# Patient Record
Sex: Female | Born: 1960 | Race: Black or African American | Hispanic: No | Marital: Married | Smoking: Never smoker
Health system: Southern US, Community
[De-identification: ages and names within clinical notes are randomized; demographics above are authoritative.]

## PROBLEM LIST (undated history)

## (undated) DIAGNOSIS — I10 Essential (primary) hypertension: Secondary | ICD-10-CM

## (undated) HISTORY — PX: TUBAL LIGATION: SHX77

---

## 2016-03-10 ENCOUNTER — Ambulatory Visit (HOSPITAL_COMMUNITY)
Admission: EM | Admit: 2016-03-10 | Discharge: 2016-03-10 | Disposition: A | Discharge status: Home / Self Care | Payer: Self-pay | Attending: Emergency Medicine | Admitting: Emergency Medicine

## 2016-03-10 ENCOUNTER — Encounter (HOSPITAL_COMMUNITY): Payer: Self-pay | Admitting: Emergency Medicine

## 2016-03-10 DIAGNOSIS — Z9889 Other specified postprocedural states: Secondary | ICD-10-CM | POA: Insufficient documentation

## 2016-03-10 DIAGNOSIS — R300 Dysuria: Secondary | ICD-10-CM | POA: Insufficient documentation

## 2016-03-10 DIAGNOSIS — R102 Pelvic and perineal pain: Secondary | ICD-10-CM | POA: Insufficient documentation

## 2016-03-10 DIAGNOSIS — N898 Other specified noninflammatory disorders of vagina: Secondary | ICD-10-CM | POA: Insufficient documentation

## 2016-03-10 DIAGNOSIS — Z114 Encounter for screening for human immunodeficiency virus [HIV]: Secondary | ICD-10-CM | POA: Insufficient documentation

## 2016-03-10 LAB — POCT URINALYSIS DIP (DEVICE)
BILIRUBIN URINE: NEGATIVE
Glucose, UA: NEGATIVE mg/dL
HGB URINE DIPSTICK: NEGATIVE
Ketones, ur: NEGATIVE mg/dL
LEUKOCYTES UA: NEGATIVE
Nitrite: NEGATIVE
Protein, ur: NEGATIVE mg/dL
SPECIFIC GRAVITY, URINE: 1.025 (ref 1.005–1.030)
Urobilinogen, UA: 0.2 mg/dL (ref 0.0–1.0)
pH: 5.5 (ref 5.0–8.0)

## 2016-03-10 MED ORDER — CEFTRIAXONE SODIUM 250 MG IJ SOLR
250.0000 mg | Freq: Once | INTRAMUSCULAR | Status: AC
  Administered 2016-03-10: 250 mg via INTRAMUSCULAR

## 2016-03-10 MED ORDER — LIDOCAINE HCL (PF) 1 % IJ SOLN
INTRAMUSCULAR | Status: AC
  Filled 2016-03-10: qty 2

## 2016-03-10 MED ORDER — AZITHROMYCIN 250 MG PO TABS
1000.0000 mg | ORAL_TABLET | Freq: Once | ORAL | Status: AC
  Administered 2016-03-10: 1000 mg via ORAL

## 2016-03-10 MED ORDER — AZITHROMYCIN 250 MG PO TABS
ORAL_TABLET | ORAL | Status: AC
  Filled 2016-03-10: qty 1

## 2016-03-10 MED ORDER — CEFTRIAXONE SODIUM 250 MG IJ SOLR
INTRAMUSCULAR | Status: AC
  Filled 2016-03-10: qty 250

## 2016-03-10 NOTE — Discharge Instructions (Signed)
You are being treated today for bacterial infection in your vaginal area. You have been given medication here that cover for common infections. Sampled were taken during the exam and will be tested. Should we need to change your antibiotics you will be contacted and a prescription will be written. I recommend you establish with a primary care provider and follow up with him or her for further evaluation and management.

## 2016-03-10 NOTE — ED Provider Notes (Signed)
CSN: [id number]     Arrival date & time 03/10/16  1007 History   First MD Initiated Contact with Patient 03/10/16 1112     Chief Complaint  Patient presents with  . Abdominal Pain   (Consider location/radiation/quality/duration/timing/severity/associated sxs/prior Treatment) 56 year old female presents to clinic with chief complaint of vaginal pain and irritation for many years. Patient is recently immigrated to the [place] from [place], been in country 1 week. States she has not received previous medical care. She states she has vaginal pain and itching, a discharge, and lower abdominal pain along with an odor. She denies fever or flank pain, N/V/D, or other systemic symptoms.   The history is provided by the patient and a relative. The history is limited by a language barrier.  Abdominal Pain  Pain location:  Suprapubic Associated symptoms: dysuria and vaginal discharge   Associated symptoms: no diarrhea, no fatigue, no fever, no nausea and no vomiting     History reviewed. No pertinent past medical history. Past Surgical History:  Procedure Laterality Date  . TUBAL LIGATION     History reviewed. No pertinent family history. Social History  Substance Use Topics  . Smoking status: Never Smoker  . Smokeless tobacco: Never Used  . Alcohol use No   OB History    No data available     Review of Systems  Constitutional: Negative for fatigue and fever.  HENT: Negative.   Respiratory: Negative.   Cardiovascular: Negative.   Gastrointestinal: Positive for abdominal pain. Negative for diarrhea, nausea and vomiting.  Genitourinary: Positive for dyspareunia, dysuria, frequency, vaginal discharge and vaginal pain. Negative for difficulty urinating and flank pain.  Musculoskeletal: Negative.   Neurological: Negative.   Hematological: Negative.     Allergies  Patient has no known allergies.  Home Medications   Prior to Admission medications   Not on File   Meds Ordered and  Administered this Visit   Medications  azithromycin (ZITHROMAX) tablet 1,000 mg (1,000 mg Oral Given 03/10/16 1150)  cefTRIAXone (ROCEPHIN) injection 250 mg (250 mg Intramuscular Given 03/10/16 1151)    BP 168/95 (BP Location: Right Arm) Comment (BP Location): large cuff  Pulse 91   Temp 99.1 F (37.3 C) (Oral)   Resp 20   SpO2 100%  No data found.   Physical Exam  Constitutional: She is oriented to person, place, and time. She appears well-developed and well-nourished. No distress.  HENT:  Head: Normocephalic.  Right Ear: External ear normal.  Left Ear: External ear normal.  Cardiovascular: Normal rate and regular rhythm.   Pulmonary/Chest: Effort normal and breath sounds normal.  Abdominal: Soft. Normal appearance and bowel sounds are normal. There is no hepatosplenomegaly. There is tenderness in the suprapubic area. There is no rigidity, no guarding and no CVA tenderness. Hernia confirmed negative in the right inguinal area and confirmed negative in the left inguinal area.  Genitourinary: Pelvic exam was performed with patient prone. There is no rash, tenderness, lesion or injury on the right labia. There is no rash, tenderness, lesion or injury on the left labia. Cervix exhibits discharge. Cervix exhibits no motion tenderness and no friability. Right adnexum displays no mass, no tenderness and no fullness. Left adnexum displays no mass, no tenderness and no fullness. There is tenderness in the vagina. No erythema or bleeding in the vagina. No foreign body in the vagina. No signs of injury around the vagina. Vaginal discharge found.  Genitourinary Comments: Creamy white discharge present from the cervix, no pain to  the uterus with manual palpation, no tenderness with palpation to the ovarian areas, ovaries not palpable on exam.  Lymphadenopathy:       Right: No inguinal adenopathy present.       Left: No inguinal adenopathy present.  Neurological: She is alert and oriented to person,  place, and time.  Skin: Skin is warm and dry. Capillary refill takes less than 2 seconds. She is not diaphoretic. No erythema. No pallor.  Psychiatric: She has a normal mood and affect.  Nursing note and vitals reviewed.   Urgent Care Course   Clinical Course     Procedures (including critical care time)  Labs Review Labs Reviewed  HIV ANTIBODY (ROUTINE TESTING)  POCT URINALYSIS DIP (DEVICE)  CERVICOVAGINAL ANCILLARY ONLY    Imaging Review No results found.   Visual Acuity Review  Right Eye Distance:   Left Eye Distance:   Bilateral Distance:    Right Eye Near:   Left Eye Near:    Bilateral Near:         MDM   1. Pelvic pain in female    Ua, Cervical cytology, and HIV antibody tests obtained. Patient treated in clinic with Azithromycin and Ceftriaxone. Patient encouraged to establish with a primary care provider for further evaluation and management as well as for a physical exam. Patient to be notified of the results of her tests and of any adjustments needed with her therapy.      Barnet Glasgow, NP 03/10/16 1223

## 2016-03-10 NOTE — ED Notes (Signed)
Verified phone number 

## 2016-03-10 NOTE — ED Triage Notes (Signed)
The patient presented to the [facility] with a complaint of lower abdominal pain x years. The patient has a hx of UTI.

## 2016-03-11 LAB — HIV ANTIBODY (ROUTINE TESTING W REFLEX): HIV Screen 4th Generation wRfx: NONREACTIVE

## 2016-03-12 LAB — CERVICOVAGINAL ANCILLARY ONLY
Chlamydia: NEGATIVE
Neisseria Gonorrhea: NEGATIVE
WET PREP (BD AFFIRM): POSITIVE — AB

## 2016-03-14 ENCOUNTER — Telehealth (HOSPITAL_COMMUNITY): Payer: Self-pay | Admitting: Emergency Medicine

## 2016-03-14 MED ORDER — METRONIDAZOLE 500 MG PO TABS: 500.0000 mg | ORAL_TABLET | Freq: Two times a day (BID) | ORAL | 0 refills | Status: DC

## 2016-03-14 MED ORDER — FLUCONAZOLE 150 MG PO TABS: 150.0000 mg | ORAL_TABLET | Freq: Every day | ORAL | 0 refills | Status: DC

## 2016-03-14 NOTE — Telephone Encounter (Signed)
-----   Message from Barnet Glasgow, NP sent at 03/12/2016  9:59 AM EST ----- Please let the patient know she was positive for Gardnerella and Candida. The treatment she received the day of her visit will not cover her infection. Call into the pharmacy of her choice Flagyl 500 mg BID x7 days, Diflucan 200 mg x2, Take one pill now, then take the second pill in 3 days.

## 2016-03-14 NOTE — Telephone Encounter (Signed)
Called pt and notified of recent lab results Pt ID'd properly... Reports feeling better Adv pt that I will be calling some meds for her Per her request... Sent them to [facility])  Adv pt if sx are not getting better to return or to f/u w/PCP Pt verb understanding.

## 2016-04-09 ENCOUNTER — Ambulatory Visit: Payer: Self-pay | Attending: Internal Medicine | Admitting: Physician Assistant

## 2016-04-09 VITALS — BP 152/108 | HR 92 | Temp 98.5°F | Resp 16 | Wt 197.0 lb

## 2016-04-09 DIAGNOSIS — M79605 Pain in left leg: Secondary | ICD-10-CM | POA: Insufficient documentation

## 2016-04-09 DIAGNOSIS — Z9889 Other specified postprocedural states: Secondary | ICD-10-CM | POA: Insufficient documentation

## 2016-04-09 DIAGNOSIS — M25561 Pain in right knee: Secondary | ICD-10-CM | POA: Insufficient documentation

## 2016-04-09 DIAGNOSIS — R102 Pelvic and perineal pain: Secondary | ICD-10-CM | POA: Insufficient documentation

## 2016-04-09 DIAGNOSIS — M7989 Other specified soft tissue disorders: Secondary | ICD-10-CM | POA: Insufficient documentation

## 2016-04-09 DIAGNOSIS — Z79899 Other long term (current) drug therapy: Secondary | ICD-10-CM | POA: Insufficient documentation

## 2016-04-09 DIAGNOSIS — M2550 Pain in unspecified joint: Secondary | ICD-10-CM

## 2016-04-09 DIAGNOSIS — M25562 Pain in left knee: Secondary | ICD-10-CM | POA: Insufficient documentation

## 2016-04-09 DIAGNOSIS — I10 Essential (primary) hypertension: Secondary | ICD-10-CM | POA: Insufficient documentation

## 2016-04-09 LAB — CBC WITH DIFFERENTIAL/PLATELET
BASOS ABS: 0 {cells}/uL (ref 0–200)
BASOS PCT: 0 %
EOS PCT: 2 %
Eosinophils Absolute: 122 cells/uL (ref 15–500)
HCT: 41.5 % (ref 35.0–45.0)
Hemoglobin: 13.9 g/dL (ref 11.7–15.5)
Lymphocytes Relative: 45 %
Lymphs Abs: 2745 cells/uL (ref 850–3900)
MCH: 28.8 pg (ref 27.0–33.0)
MCHC: 33.5 g/dL (ref 32.0–36.0)
MCV: 85.9 fL (ref 80.0–100.0)
MONOS PCT: 8 %
MPV: 9.4 fL (ref 7.5–12.5)
Monocytes Absolute: 488 cells/uL (ref 200–950)
NEUTROS ABS: 2745 {cells}/uL (ref 1500–7800)
Neutrophils Relative %: 45 %
PLATELETS: 282 10*3/uL (ref 140–400)
RBC: 4.83 MIL/uL (ref 3.80–5.10)
RDW: 13.8 % (ref 11.0–15.0)
WBC: 6.1 10*3/uL (ref 3.8–10.8)

## 2016-04-09 LAB — BASIC METABOLIC PANEL
BUN: 17 mg/dL (ref 7–25)
CALCIUM: 10.1 mg/dL (ref 8.6–10.4)
CO2: 24 mmol/L (ref 20–31)
Chloride: 106 mmol/L (ref 98–110)
Creat: 1.1 mg/dL — ABNORMAL HIGH (ref 0.50–1.05)
Glucose, Bld: 130 mg/dL — ABNORMAL HIGH (ref 65–99)
Potassium: 4.8 mmol/L (ref 3.5–5.3)
Sodium: 140 mmol/L (ref 135–146)

## 2016-04-09 LAB — TSH: TSH: 1.51 m[IU]/L

## 2016-04-09 MED ORDER — LISINOPRIL 10 MG PO TABS: 10.0000 mg | ORAL_TABLET | Freq: Every day | ORAL | 3 refills | Status: DC

## 2016-04-09 NOTE — Progress Notes (Signed)
Does not take medication for BP. She did at one time at home.

## 2016-04-09 NOTE — Progress Notes (Signed)
Carrin Vannostrand  [id number]  [id number]  DOB - 1960/07/24  Chief Complaint  Patient presents with  . Hypertension  . Pelvic Pain  . Joint Pain       Subjective:   Shannon Singleton is a 56 y.o. female here today for establishment of care. She has little PMHx documented. She does state that in [place] she had some high blood pressure and use to take medication. She was in the ED on 03/10/2016 with complaints of vaginal pain, irritation, itching and lower abdominal pain. She also had an odor. No fevers or chills. No nausea, vomiting or diaphoresis. Her blood pressure was elevated at 168/95. A dipstick was negative. HIV was negative. Abdomen cultures revealed Gardnerella and candida and she was treated with Flagyl and Diflucan.  From a discharge standpoint she is doing much better. She still has persistent pelvic pain. Her last menstrual period was in 2003. She has a history of fibroids possibly. She  She also complains of bilateral knee pain. She states that multiple joints ache all the time. She has pain down her left calf with some swelling left side greater than right.   ROS: GEN: denies fever or chills, denies change in weight Skin: denies lesions or rashes HEENT: denies headache, earache, epistaxis, sore throat, or neck pain LUNGS: denies SHOB, dyspnea, PND, orthopnea CV: denies CP or palpitations ABD: denies abd pain, N or V; +pelvic pain EXT: denies muscle spasms +swelling; + pain in lower ext, no weakness NEURO: denies numbness or tingling, denies sz, stroke or TIA  ALLERGIES: No Known Allergies  PAST MEDICAL HISTORY: No past medical history on file.  PAST SURGICAL HISTORY: Past Surgical History:  Procedure Laterality Date  . TUBAL LIGATION      MEDICATIONS AT HOME: Prior to Admission medications   Medication Sig Start Date End Date Taking? Authorizing Provider  fluconazole (DIFLUCAN) 150 MG tablet Take 1 tablet (150 mg total) by mouth daily. Take one  pill now, then take the second pill in 3 days. Patient not taking: Reported on 04/09/2016 03/14/16   Barnet Glasgow, NP  lisinopril (PRINIVIL,ZESTRIL) 10 MG tablet Take 1 tablet (10 mg total) by mouth daily. 04/09/16   Aedon Deason Daneil Dan, PA-C  metroNIDAZOLE (FLAGYL) 500 MG tablet Take 1 tablet (500 mg total) by mouth 2 (two) times daily. Patient not taking: Reported on 04/09/2016 03/14/16   Barnet Glasgow, NP     Objective:   Vitals:   04/09/16 1337  BP: (!) 152/108  Pulse: 92  Resp: 16  Temp: 98.5 F (36.9 C)  TempSrc: Oral  SpO2: 98%  Weight: 197 lb (89.4 kg)    Exam General appearance : Awake, alert, not in any distress. Speech Clear. Not toxic looking HEENT: Atraumatic and Normocephalic, pupils equally reactive to light and accomodation Neck: supple, no JVD. No cervical lymphadenopathy.  Chest:Good air entry bilaterally, no added sounds  CVS: S1 S2 regular, no murmurs.  Abdomen: Bowel sounds present, Non tender and not distended with no gaurding, rigidity or rebound. Extremities: B/L Lower Ext shows sonme edema, L>R both legs are warm to touch, good pulses; full ROM Neurology: Awake alert, and oriented X 3, CN II-XII intact, Non focal Skin:No Rash   Assessment & Plan  1. HTN  -Add ACE  -DASH diet  -check BMP 2. Joint pain  -routine labs +ANA, ESR, RF  -prn pain meds 3. Left leg pain and swelling  -Venous US 4. Pelvic pain  -GYN referral  -cont NSAIDS prn  Return  in about 2 weeks (around 04/23/2016).  The patient was given clear instructions to go to ER or return to medical center if symptoms don't improve, worsen or new problems develop. The patient verbalized understanding. The patient was told to call to get lab results if they haven't heard anything in the next week.   This note has been created with Surveyor, quantity. Any transcriptional errors are unintentional.    Zettie Pho, PA-C [facility] and  [facility] [place], [place]   04/09/2016, 2:07 PM

## 2016-04-10 ENCOUNTER — Ambulatory Visit (HOSPITAL_COMMUNITY)
Admission: RE | Admit: 2016-04-10 | Discharge: 2016-04-10 | Disposition: A | Discharge status: Home / Self Care | Payer: Self-pay | Source: Ambulatory Visit | Attending: Physician Assistant | Admitting: Physician Assistant

## 2016-04-10 ENCOUNTER — Telehealth: Payer: Self-pay | Admitting: Internal Medicine

## 2016-04-10 DIAGNOSIS — M79605 Pain in left leg: Secondary | ICD-10-CM | POA: Insufficient documentation

## 2016-04-10 LAB — RHEUMATOID FACTOR: Rhuematoid fact SerPl-aCnc: <14 IU/mL (ref <14)

## 2016-04-10 LAB — VITAMIN D 25 HYDROXY (VIT D DEFICIENCY, FRACTURES): Vit D, 25-Hydroxy: 25 ng/mL — ABNORMAL LOW (ref 30–100)

## 2016-04-10 LAB — SEDIMENTATION RATE: Sed Rate: 8 mm/hr (ref 0–30)

## 2016-04-10 NOTE — Telephone Encounter (Signed)
[place] from [facility] called with verbal : venous study negative

## 2016-04-10 NOTE — Progress Notes (Signed)
VASCULAR LAB PRELIMINARY  PRELIMINARY  PRELIMINARY  PRELIMINARY  Left lower extremity venous duplex completed.    Preliminary report:  Left:  No evidence of DVT, superficial thrombosis, or Baker's cyst.  Josee Speece, RVS 04/10/2016, 12:02 PM

## 2016-04-11 LAB — ANA,IFA RA DIAG PNL W/RFLX TIT/PATN
ANA: NEGATIVE
Cyclic Citrullin Peptide Ab: <16 Units

## 2016-04-14 ENCOUNTER — Telehealth: Payer: Self-pay | Admitting: *Deleted

## 2016-04-14 NOTE — Telephone Encounter (Signed)
Patient verified DOB Patient's daughter is aware of [place] being negative for clots. Patient is aware of labs being normal except Vitamin D level being low. Patient advised to pick up an OTC supplement and begin taking. Patient will have a recheck at the next FU visit. Patients daughter expressed her understanding and had no further questions at this time.

## 2016-04-14 NOTE — Telephone Encounter (Signed)
-----   Message from Brayton Caves, [place] sent at 04/14/2016  8:19 AM EST ----- Good Morning [place]!  Can you pls let her or her daughter know that the [place] of her leg was negative for clots or any other abnormality. [place], her blood work looked good with the exception of low Vit D. Encourage her to get a supplement OTC. Encourage her to keep her follow up appt. Thanks.   TN ----- Message ----- From: Interface, Rad Results In Sent: 04/10/2016   6:34 PM To: Brayton Caves, PA-C

## 2016-04-28 ENCOUNTER — Other Ambulatory Visit: Payer: Self-pay | Admitting: *Deleted

## 2016-04-28 DIAGNOSIS — R102 Pelvic and perineal pain: Secondary | ICD-10-CM

## 2016-05-12 ENCOUNTER — Ambulatory Visit (HOSPITAL_COMMUNITY)
Admission: RE | Admit: 2016-05-12 | Discharge: 2016-05-12 | Disposition: A | Discharge status: Home / Self Care | Payer: Self-pay | Source: Ambulatory Visit | Attending: Family Medicine | Admitting: Family Medicine

## 2016-05-12 DIAGNOSIS — D252 Subserosal leiomyoma of uterus: Secondary | ICD-10-CM | POA: Insufficient documentation

## 2016-05-12 DIAGNOSIS — R102 Pelvic and perineal pain: Secondary | ICD-10-CM | POA: Insufficient documentation

## 2016-05-14 ENCOUNTER — Encounter: Payer: Self-pay | Admitting: Obstetrics and Gynecology

## 2016-05-15 ENCOUNTER — Encounter: Payer: Self-pay | Admitting: Obstetrics and Gynecology

## 2016-05-15 NOTE — Progress Notes (Signed)
Patient did not keep GYN appointment for 05/14/2016.  Durene Romans MD Attending Center for [employer])

## 2016-05-18 ENCOUNTER — Encounter (HOSPITAL_COMMUNITY): Payer: Self-pay

## 2016-05-18 ENCOUNTER — Emergency Department (HOSPITAL_COMMUNITY)
Admission: EM | Admit: 2016-05-18 | Discharge: 2016-05-18 | Disposition: A | Discharge status: Home / Self Care | Payer: No Typology Code available for payment source | Attending: Emergency Medicine | Admitting: Emergency Medicine

## 2016-05-18 ENCOUNTER — Emergency Department (HOSPITAL_COMMUNITY): Payer: No Typology Code available for payment source

## 2016-05-18 DIAGNOSIS — S40011A Contusion of right shoulder, initial encounter: Secondary | ICD-10-CM | POA: Insufficient documentation

## 2016-05-18 DIAGNOSIS — Y999 Unspecified external cause status: Secondary | ICD-10-CM | POA: Diagnosis not present

## 2016-05-18 DIAGNOSIS — I10 Essential (primary) hypertension: Secondary | ICD-10-CM | POA: Diagnosis not present

## 2016-05-18 DIAGNOSIS — Z79899 Other long term (current) drug therapy: Secondary | ICD-10-CM | POA: Diagnosis not present

## 2016-05-18 DIAGNOSIS — S4991XA Unspecified injury of right shoulder and upper arm, initial encounter: Secondary | ICD-10-CM | POA: Diagnosis present

## 2016-05-18 DIAGNOSIS — Y92410 Unspecified street and highway as the place of occurrence of the external cause: Secondary | ICD-10-CM | POA: Insufficient documentation

## 2016-05-18 DIAGNOSIS — Y939 Activity, unspecified: Secondary | ICD-10-CM | POA: Diagnosis not present

## 2016-05-18 HISTORY — DX: Essential (primary) hypertension: I10

## 2016-05-18 MED ORDER — IBUPROFEN 800 MG PO TABS
800.0000 mg | ORAL_TABLET | Freq: Once | ORAL | Status: AC
  Administered 2016-05-18: 800 mg via ORAL
  Filled 2016-05-18: qty 1
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
## 2016-05-18 NOTE — ED Notes (Signed)
Patient transported to X-ray 

## 2016-05-18 NOTE — Discharge Instructions (Signed)
Take acetaminophen, ibuprofen or naproxen as needed for pain.

## 2016-05-23 ENCOUNTER — Ambulatory Visit (INDEPENDENT_AMBULATORY_CARE_PROVIDER_SITE_OTHER): Payer: Self-pay | Admitting: Obstetrics & Gynecology

## 2016-05-23 ENCOUNTER — Encounter: Payer: Self-pay | Admitting: Obstetrics & Gynecology

## 2016-05-23 VITALS — BP 139/92 | HR 87 | Wt 208.8 lb

## 2016-05-23 DIAGNOSIS — R102 Pelvic and perineal pain: Secondary | ICD-10-CM

## 2016-05-23 DIAGNOSIS — Z1151 Encounter for screening for human papillomavirus (HPV): Secondary | ICD-10-CM

## 2016-05-23 DIAGNOSIS — Z124 Encounter for screening for malignant neoplasm of cervix: Secondary | ICD-10-CM

## 2016-05-23 DIAGNOSIS — Z01419 Encounter for gynecological examination (general) (routine) without abnormal findings: Secondary | ICD-10-CM

## 2016-05-23 MED ORDER — DICLOFENAC SODIUM 75 MG PO TBEC: 75.0000 mg | DELAYED_RELEASE_TABLET | Freq: Two times a day (BID) | ORAL | 2 refills | Status: AC

## 2016-05-23 MED ORDER — LISINOPRIL 10 MG PO TABS: 10.0000 mg | ORAL_TABLET | Freq: Every day | ORAL | 3 refills | Status: AC

## 2016-05-23 NOTE — Patient Instructions (Signed)
Pelvic Pain, Female Pelvic pain is pain in your lower abdomen, below your belly button and between your hips. The pain may start suddenly (acute), keep coming back (recurring), or last a long time (chronic). Pelvic pain that lasts longer than six months is considered chronic. Pelvic pain may affect your:  Reproductive organs.  Urinary system.  Digestive tract.  Musculoskeletal system. There are many potential causes of pelvic pain. Sometimes, the pain can be a result of digestive or urinary conditions, strained muscles or ligaments, or even reproductive conditions. Sometimes the cause of pelvic pain is not known. Follow these instructions at home:  Take over-the-counter and prescription medicines only as told by your health care provider.  Rest as told by your health care provider.  Do not have sex it if hurts.  Keep a journal of your pelvic pain. Write down:  When the pain started.  Where the pain is located.  What seems to make the pain better or worse, such as food or your menstrual cycle.  Any symptoms you have along with the pain.  Keep all follow-up visits as told by your health care provider. This is important. Contact a health care provider if:  Medicine does not help your pain.  Your pain comes back.  You have new symptoms.  You have abnormal vaginal discharge or bleeding, including bleeding after menopause.  You have a fever or chills.  You are constipated.  You have blood in your urine or stool.  You have foul-smelling urine.  You feel weak or lightheaded. Get help right away if:  You have sudden severe pain.  Your pain gets steadily worse.  You have severe pain along with fever, nausea, vomiting, or excessive sweating.  You lose consciousness. This information is not intended to replace advice given to you by your health care provider. Make sure you discuss any questions you have with your health care provider. Document Released: 01/15/2004  Document Revised: 03/14/2015 Document Reviewed: 12/08/2014 Elsevier Interactive Patient Education  2017 [place]. Hypertension Hypertension, commonly called high blood pressure, is when the force of blood pumping through the arteries is too strong. The arteries are the blood vessels that carry blood from the heart throughout the body. Hypertension forces the heart to work harder to pump blood and may cause arteries to become narrow or stiff. Having untreated or uncontrolled hypertension can cause heart attacks, strokes, kidney disease, and other problems. A blood pressure reading consists of a higher number over a lower number. Ideally, your blood pressure should be below 120/80. The first ("top") number is called the systolic pressure. It is a measure of the pressure in your arteries as your heart beats. The second ("bottom") number is called the diastolic pressure. It is a measure of the pressure in your arteries as the heart relaxes. What are the causes? The cause of this condition is not known. What increases the risk? Some risk factors for high blood pressure are under your control. Others are not. Factors you can change   Smoking.  Having type 2 diabetes mellitus, high cholesterol, or both.  Not getting enough exercise or physical activity.  Being overweight.  Having too much fat, sugar, calories, or salt (sodium) in your diet.  Drinking too much alcohol. Factors that are difficult or impossible to change   Having chronic kidney disease.  Having a family history of high blood pressure.  Age. Risk increases with age.  Race. You may be at higher risk if you are African-American.  Gender.  Men are at higher risk than women before age 16. After age 76, women are at higher risk than men.  Having obstructive sleep apnea.  Stress. What are the signs or symptoms? Extremely high blood pressure (hypertensive crisis) may cause:  Headache.  Anxiety.  Shortness of  breath.  Nosebleed.  Nausea and vomiting.  Severe chest pain.  Jerky movements you cannot control (seizures). How is this diagnosed? This condition is diagnosed by measuring your blood pressure while you are seated, with your arm resting on a surface. The cuff of the blood pressure monitor will be placed directly against the skin of your upper arm at the level of your heart. It should be measured at least twice using the same arm. Certain conditions can cause a difference in blood pressure between your right and left arms. Certain factors can cause blood pressure readings to be lower or higher than normal (elevated) for a short period of time:  When your blood pressure is higher when you are in a health care provider's office than when you are at home, this is called white coat hypertension. Most people with this condition do not need medicines.  When your blood pressure is higher at home than when you are in a health care provider's office, this is called masked hypertension. Most people with this condition may need medicines to control blood pressure. If you have a high blood pressure reading during one visit or you have normal blood pressure with other risk factors:  You may be asked to return on a different day to have your blood pressure checked again.  You may be asked to monitor your blood pressure at home for 1 week or longer. If you are diagnosed with hypertension, you may have other blood or imaging tests to help your health care provider understand your overall risk for other conditions. How is this treated? This condition is treated by making healthy lifestyle changes, such as eating healthy foods, exercising more, and reducing your alcohol intake. Your health care provider may prescribe medicine if lifestyle changes are not enough to get your blood pressure under control, and if:  Your systolic blood pressure is above 130.  Your diastolic blood pressure is above 80. Your  personal target blood pressure may vary depending on your medical conditions, your age, and other factors. Follow these instructions at home: Eating and drinking   Eat a diet that is high in fiber and potassium, and low in sodium, added sugar, and fat. An example eating plan is called the DASH (Dietary Approaches to Stop Hypertension) diet. To eat this way:  Eat plenty of fresh fruits and vegetables. Try to fill half of your plate at each meal with fruits and vegetables.  Eat whole grains, such as whole wheat pasta, brown rice, or whole grain bread. Fill about one quarter of your plate with whole grains.  Eat or drink low-fat dairy products, such as skim milk or low-fat yogurt.  Avoid fatty cuts of meat, processed or cured meats, and poultry with skin. Fill about one quarter of your plate with lean proteins, such as fish, chicken without skin, beans, eggs, and tofu.  Avoid premade and processed foods. These tend to be higher in sodium, added sugar, and fat.  Reduce your daily sodium intake. Most people with hypertension should eat less than 1,500 mg of sodium a day.  Limit alcohol intake to no more than 1 drink a day for nonpregnant women and 2 drinks a day for men. One  drink equals 12 oz of beer, 5 oz of wine, or 1 oz of hard liquor. Lifestyle   Work with your health care provider to maintain a healthy body weight or to lose weight. Ask what an ideal weight is for you.  Get at least 30 minutes of exercise that causes your heart to beat faster (aerobic exercise) most days of the week. Activities may include walking, swimming, or biking.  Include exercise to strengthen your muscles (resistance exercise), such as pilates or lifting weights, as part of your weekly exercise routine. Try to do these types of exercises for 30 minutes at least 3 days a week.  Do not use any products that contain nicotine or tobacco, such as cigarettes and e-cigarettes. If you need help quitting, ask your health  care provider.  Monitor your blood pressure at home as told by your health care provider.  Keep all follow-up visits as told by your health care provider. This is important. Medicines   Take over-the-counter and prescription medicines only as told by your health care provider. Follow directions carefully. Blood pressure medicines must be taken as prescribed.  Do not skip doses of blood pressure medicine. Doing this puts you at risk for problems and can make the medicine less effective.  Ask your health care provider about side effects or reactions to medicines that you should watch for. Contact a health care provider if:  You think you are having a reaction to a medicine you are taking.  You have headaches that keep coming back (recurring).  You feel dizzy.  You have swelling in your ankles.  You have trouble with your vision. Get help right away if:  You develop a severe headache or confusion.  You have unusual weakness or numbness.  You feel faint.  You have severe pain in your chest or abdomen.  You vomit repeatedly.  You have trouble breathing. Summary  Hypertension is when the force of blood pumping through your arteries is too strong. If this condition is not controlled, it may put you at risk for serious complications.  Your personal target blood pressure may vary depending on your medical conditions, your age, and other factors. For most people, a normal blood pressure is less than 120/80.  Hypertension is treated with lifestyle changes, medicines, or a combination of both. Lifestyle changes include weight loss, eating a healthy, low-sodium diet, exercising more, and limiting alcohol. This information is not intended to replace advice given to you by your health care provider. Make sure you discuss any questions you have with your health care provider. Document Released: 02/17/2005 Document Revised: 01/16/2016 Document Reviewed: 01/16/2016 Elsevier Interactive  Patient Education  2017 [employer].

## 2016-05-23 NOTE — Progress Notes (Signed)
Patient never received medication
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
## 2016-05-26 LAB — CYTOLOGY - PAP
DIAGNOSIS: NEGATIVE
HPV (WINDOPATH): NOT DETECTED

## 2016-07-21 ENCOUNTER — Ambulatory Visit (HOSPITAL_COMMUNITY)
Admission: EM | Admit: 2016-07-21 | Discharge: 2016-07-21 | Disposition: A | Discharge status: Home / Self Care | Payer: Self-pay | Attending: Family Medicine | Admitting: Family Medicine

## 2016-07-21 ENCOUNTER — Encounter (HOSPITAL_COMMUNITY): Payer: Self-pay | Admitting: Emergency Medicine

## 2016-07-21 DIAGNOSIS — M25552 Pain in left hip: Secondary | ICD-10-CM

## 2016-07-21 DIAGNOSIS — R109 Unspecified abdominal pain: Secondary | ICD-10-CM

## 2016-07-21 DIAGNOSIS — S39012A Strain of muscle, fascia and tendon of lower back, initial encounter: Secondary | ICD-10-CM

## 2016-07-21 LAB — POCT URINALYSIS DIP (DEVICE)
Bilirubin Urine: NEGATIVE
Glucose, UA: NEGATIVE mg/dL
Hgb urine dipstick: NEGATIVE
KETONES UR: NEGATIVE mg/dL
Leukocytes, UA: NEGATIVE
Nitrite: NEGATIVE
PH: 6 (ref 5.0–8.0)
PROTEIN: NEGATIVE mg/dL
Specific Gravity, Urine: 1.025 (ref 1.005–1.030)
Urobilinogen, UA: 0.2 mg/dL (ref 0.0–1.0)

## 2016-07-21 MED ORDER — NAPROXEN 500 MG PO TABS: 500.0000 mg | ORAL_TABLET | Freq: Two times a day (BID) | ORAL | 0 refills | Status: DC

## 2016-07-21 MED ORDER — CYCLOBENZAPRINE HCL 10 MG PO TABS: ORAL_TABLET | ORAL | 0 refills | Status: DC

## 2016-07-21 NOTE — ED Provider Notes (Signed)
CSN: [id number]     Arrival date & time 07/21/16  1731 History   None    Chief Complaint  Patient presents with  . Abdominal Pain   (Consider location/radiation/quality/duration/timing/severity/associated sxs/prior Treatment) Patient c/o left hip and back pain.  She c/o left wrist splint.   The history is provided by the patient.  Abdominal Pain  Pain location:  Generalized Pain quality: aching   Pain radiates to:  Does not radiate Pain severity:  No pain Onset quality:  Sudden Duration:  1 week Timing:  Constant Progression:  Worsening   Past Medical History:  Diagnosis Date  . Hypertension    Past Surgical History:  Procedure Laterality Date  . TUBAL LIGATION     No family history on file. Social History  Substance Use Topics  . Smoking status: Never Smoker  . Smokeless tobacco: Never Used  . Alcohol use No   OB History    No data available     Review of Systems  Constitutional: Negative.   HENT: Negative.   Eyes: Negative.   Respiratory: Negative.   Cardiovascular: Negative.   Gastrointestinal: Positive for abdominal pain.  Endocrine: Negative.   Genitourinary: Negative.   Musculoskeletal: Positive for arthralgias.  Allergic/Immunologic: Negative.   Neurological: Negative.   Hematological: Negative.   Psychiatric/Behavioral: Negative.  Negative for agitation.    Allergies  Patient has no known allergies.  Home Medications   Prior to Admission medications   Medication Sig Start Date End Date Taking? Authorizing Provider  cyclobenzaprine (FLEXERIL) 10 MG tablet One po tid prn pain 07/21/16   Lysbeth Penner, FNP  diclofenac (VOLTAREN) 75 MG EC tablet Take 1 tablet (75 mg total) by mouth 2 (two) times daily with a meal. 05/23/16   Lavonia Drafts, MD  lisinopril (PRINIVIL,ZESTRIL) 10 MG tablet Take 1 tablet (10 mg total) by mouth daily. Patient taking differently: Take 10 mg by mouth daily. Patient "finished " blood pressure medicine -did  not get more. 05/23/16   Lavonia Drafts, MD  naproxen (NAPROSYN) 500 MG tablet Take 1 tablet (500 mg total) by mouth 2 (two) times daily with a meal. 07/21/16   Bethanie Bloxom, Orson Ape, FNP   Meds Ordered and Administered this Visit  Medications - No data to display  BP (!) 171/101 (BP Location: Right Arm) Comment: rn notified/  patient not taking blood pressure medicine-klh  Pulse 79   Temp 99.2 F (37.3 C) (Oral)   Resp 14   SpO2 99%  No data found.   Physical Exam  Constitutional: She appears well-developed and well-nourished.  HENT:  Head: Normocephalic and atraumatic.  Eyes: Conjunctivae and EOM are normal. Pupils are equal, round, and reactive to light.  Neck: Normal range of motion. Neck supple.  Cardiovascular: Normal rate, regular rhythm and normal heart sounds.   Pulmonary/Chest: Effort normal and breath sounds normal.  Abdominal: Soft. Bowel sounds are normal.  Musculoskeletal: Normal range of motion.  Nursing note and vitals reviewed.   Urgent Care Course     Procedures (including critical care time)  Labs Review Labs Reviewed  POCT URINALYSIS DIP (DEVICE)    Imaging Review No results found.   Visual Acuity Review  Right Eye Distance:   Left Eye Distance:   Bilateral Distance:    Right Eye Near:   Left Eye Near:    Bilateral Near:         MDM   1. Strain of lumbar region, initial encounter   2. Left hip pain  Naprosyn 500mg  one po bid x 10 days Flexeril 10mg  one po tid prn pain #30     Lysbeth Penner, FNP 07/21/16 1842

## 2016-07-21 NOTE — ED Triage Notes (Signed)
Pain in lower back and left leg as well as lower left abdomen.  Onset one week ago.  Left hand numbness, intermittently.

## 2016-10-25 ENCOUNTER — Ambulatory Visit (HOSPITAL_COMMUNITY)
Admission: EM | Admit: 2016-10-25 | Discharge: 2016-10-25 | Disposition: A | Discharge status: Home / Self Care | Payer: Self-pay | Attending: Internal Medicine | Admitting: Internal Medicine

## 2016-10-25 ENCOUNTER — Encounter (HOSPITAL_COMMUNITY): Payer: Self-pay | Admitting: Family Medicine

## 2016-10-25 DIAGNOSIS — R350 Frequency of micturition: Secondary | ICD-10-CM | POA: Insufficient documentation

## 2016-10-25 DIAGNOSIS — I10 Essential (primary) hypertension: Secondary | ICD-10-CM | POA: Insufficient documentation

## 2016-10-25 DIAGNOSIS — M7662 Achilles tendinitis, left leg: Secondary | ICD-10-CM | POA: Insufficient documentation

## 2016-10-25 DIAGNOSIS — R300 Dysuria: Secondary | ICD-10-CM | POA: Insufficient documentation

## 2016-10-25 LAB — POCT URINALYSIS DIP (DEVICE)
Bilirubin Urine: NEGATIVE
GLUCOSE, UA: NEGATIVE mg/dL
Hgb urine dipstick: NEGATIVE
KETONES UR: NEGATIVE mg/dL
LEUKOCYTES UA: NEGATIVE
Nitrite: NEGATIVE
Protein, ur: NEGATIVE mg/dL
SPECIFIC GRAVITY, URINE: 1.02 (ref 1.005–1.030)
Urobilinogen, UA: 0.2 mg/dL (ref 0.0–1.0)
pH: 7 (ref 5.0–8.0)

## 2016-10-25 MED ORDER — NAPROXEN 500 MG PO TABS: 500.0000 mg | ORAL_TABLET | Freq: Two times a day (BID) | ORAL | 0 refills | Status: DC

## 2016-10-25 MED ORDER — PHENAZOPYRIDINE HCL 200 MG PO TABS: 200.0000 mg | ORAL_TABLET | Freq: Three times a day (TID) | ORAL | 0 refills | Status: AC

## 2016-10-25 NOTE — ED Provider Notes (Signed)
  [place]   [id number] 10/25/16 Arrival Time: 1205  ASSESSMENT & PLAN:  1. Urinary frequency   2. Dysuria   3. Tendonitis, Achilles, left     Meds ordered this encounter  Medications  . naproxen (NAPROSYN) 500 MG tablet    Sig: Take 1 tablet (500 mg total) by mouth 2 (two) times daily with a meal.    Dispense:  20 tablet    Refill:  0    Order Specific Question:   Supervising Provider    AnswerVanessa Kick [[id number]]  . phenazopyridine (PYRIDIUM) 200 MG tablet    Sig: Take 1 tablet (200 mg total) by mouth 3 (three) times daily.    Dispense:  6 tablet    Refill:  0    Order Specific Question:   Supervising Provider    Answer:   Vanessa Kick [[id number]]    Reviewed expectations re: course of current medical issues. Questions answered. Outlined signs and symptoms indicating need for more acute intervention. Patient verbalized understanding. After Visit Summary given.   SUBJECTIVE:  Shannon Singleton is a 56 y.o. female who presents with complaint of dysuria and urinary frequency.  She is c/o left ankle and heel pain.  ROS: As per HPI.   OBJECTIVE:  Vitals:   10/25/16 1234  BP: (!) 164/79  Pulse: 73  Resp: 18  Temp: 98.5 F (36.9 C)  SpO2: 100%     General appearance: alert; no distress Eyes: PERRLA; EOMI; conjunctiva normal HENT: normocephalic; atraumatic; Lungs: clear to auscultation bilaterally Heart: regular rate and rhythm Abdomen: soft, non-tender; bowel sounds normal; no masses or organomegaly; no guarding or rebound tenderness Extremities: TTP left heel and achilles tendon. Neurologic: normal gait; normal symmetric reflexes Psychological: alert and cooperative; normal mood and affect  Past Medical History:  Diagnosis Date  . Hypertension      has a past medical history of Hypertension.  Results for orders placed or performed during the hospital encounter of 10/25/16  POCT urinalysis dip (device)  Result Value Ref Range   Glucose, UA NEGATIVE NEGATIVE mg/dL   Bilirubin Urine NEGATIVE NEGATIVE   Ketones, ur NEGATIVE NEGATIVE mg/dL   Specific Gravity, Urine 1.020 1.005 - 1.030   Hgb urine dipstick NEGATIVE NEGATIVE   pH 7.0 5.0 - 8.0   Protein, ur NEGATIVE NEGATIVE mg/dL   Urobilinogen, UA 0.2 0.0 - 1.0 mg/dL   Nitrite NEGATIVE NEGATIVE   Leukocytes, UA NEGATIVE NEGATIVE    Labs Reviewed  URINE CULTURE  POCT URINALYSIS DIP (DEVICE)    Imaging: No results found.  No Known Allergies  History reviewed. No pertinent family history. Past Surgical History:  Procedure Laterality Date  . TUBAL LIGATION           Lysbeth Penner, [place] 10/25/16 1331

## 2016-10-25 NOTE — ED Triage Notes (Signed)
Pt here for pain in groin area that radiates into her leg. sts also some urinary frequency.

## 2016-10-26 LAB — URINE CULTURE: Culture: NO GROWTH

## 2017-02-05 ENCOUNTER — Encounter (HOSPITAL_COMMUNITY): Payer: Self-pay | Admitting: Emergency Medicine

## 2017-02-05 ENCOUNTER — Ambulatory Visit (HOSPITAL_COMMUNITY)
Admission: EM | Admit: 2017-02-05 | Discharge: 2017-02-05 | Disposition: A | Discharge status: Home / Self Care | Payer: Self-pay | Attending: Family Medicine | Admitting: Family Medicine

## 2017-02-05 DIAGNOSIS — I10 Essential (primary) hypertension: Secondary | ICD-10-CM

## 2017-02-05 DIAGNOSIS — R109 Unspecified abdominal pain: Secondary | ICD-10-CM

## 2017-02-05 DIAGNOSIS — S39012A Strain of muscle, fascia and tendon of lower back, initial encounter: Secondary | ICD-10-CM

## 2017-02-05 LAB — POCT URINALYSIS DIP (DEVICE)
BILIRUBIN URINE: NEGATIVE
GLUCOSE, UA: NEGATIVE mg/dL
KETONES UR: NEGATIVE mg/dL
Leukocytes, UA: NEGATIVE
NITRITE: NEGATIVE
PROTEIN: NEGATIVE mg/dL
Specific Gravity, Urine: 1.025 (ref 1.005–1.030)
Urobilinogen, UA: 0.2 mg/dL (ref 0.0–1.0)
pH: 6.5 (ref 5.0–8.0)

## 2017-02-05 LAB — POCT I-STAT, CHEM 8
BUN: 12 mg/dL (ref 6–20)
Calcium, Ion: 1.21 mmol/L (ref 1.15–1.40)
Chloride: 103 mmol/L (ref 101–111)
Creatinine, Ser: 0.8 mg/dL (ref 0.44–1.00)
Glucose, Bld: 95 mg/dL (ref 65–99)
HEMATOCRIT: 42 % (ref 36.0–46.0)
HEMOGLOBIN: 14.3 g/dL (ref 12.0–15.0)
Potassium: 3.9 mmol/L (ref 3.5–5.1)
SODIUM: 142 mmol/L (ref 135–145)
TCO2: 30 mmol/L (ref 22–32)

## 2017-02-05 MED ORDER — CYCLOBENZAPRINE HCL 10 MG PO TABS: 10.0000 mg | ORAL_TABLET | Freq: Two times a day (BID) | ORAL | 0 refills | Status: AC | PRN

## 2017-02-05 MED ORDER — NAPROXEN 375 MG PO TABS: 375.0000 mg | ORAL_TABLET | Freq: Two times a day (BID) | ORAL | 0 refills | Status: AC

## 2017-02-05 MED ORDER — LISINOPRIL 10 MG PO TABS: 10.0000 mg | ORAL_TABLET | Freq: Every day | ORAL | 1 refills | Status: AC

## 2017-02-05 NOTE — ED Provider Notes (Signed)
[facility]    CSN: [id number] Arrival date & time: 02/05/17  1003     History   Chief Complaint Chief Complaint  Patient presents with  . Back Pain  . Flank Pain    HPI QUINITA KOSTELECKY is a 56 y.o. female with establish H/O HTN presented to clinic with CC of Left lower back/Flank pain radiating to left groin. 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 Denies N/V. Denies change in habits of urination. Denies blood in urine. Denies constipation or diarrhea. BP elevated upon arrival. Pt was taking Lisinopril 10 mg and stop taking as she do not have health insurance and unable to get to PCP for Refills. Denies headache, CP, SOB, change in vision or blurry vision. No acute visible distress. Will check urine of occult blood to r/o possibility of renal calculi and give refill on lisinopril with instructions to establish care with PCP.   The history is provided by the patient.  Back Pain  Location:  Lumbar spine Quality:  Aching Radiates to:  L thigh (LLQ) Pain severity:  Moderate Pain is:  Worse during the day Onset quality:  Gradual Timing:  Constant Progression:  Worsening Relieved by:  NSAIDs Worsened by:  Bending and movement Associated symptoms: abdominal pain (left flank pain radiating to left groin )   Flank Pain  Associated symptoms include abdominal pain (left flank pain radiating to left groin ).    Past Medical History:  Diagnosis Date  . Hypertension     There are no active problems to display for this patient.   Past Surgical History:  Procedure Laterality Date  . TUBAL LIGATION      OB History    No data available       Home Medications    Prior to Admission medications   Medication Sig Start Date End Date Taking? Authorizing Provider  cyclobenzaprine (FLEXERIL) 10 MG tablet One po tid prn pain 07/21/16   Lysbeth Penner, FNP  diclofenac (VOLTAREN) 75 MG EC  tablet Take 1 tablet (75 mg total) by mouth 2 (two) times daily with a meal. 05/23/16   Lavonia Drafts, MD  lisinopril (PRINIVIL,ZESTRIL) 10 MG tablet Take 1 tablet (10 mg total) by mouth daily. Patient taking differently: Take 10 mg by mouth daily. Patient "finished " blood pressure medicine -did not get more. 05/23/16   Lavonia Drafts, MD  naproxen (NAPROSYN) 500 MG tablet Take 1 tablet (500 mg total) by mouth 2 (two) times daily with a meal. 10/25/16   [place], Orson Ape, FNP  phenazopyridine (PYRIDIUM) 200 MG tablet Take 1 tablet (200 mg total) by mouth 3 (three) times daily. 10/25/16   Lysbeth Penner, FNP    Family History History reviewed. No pertinent family history.  Social History Social History   Tobacco Use  . Smoking status: Never Smoker  . Smokeless tobacco: Never Used  Substance Use Topics  . Alcohol use: No  . Drug use: No     Allergies   Patient has no known allergies.   Review of Systems Review of Systems  Constitutional: Negative.   HENT: Negative.   Respiratory: Negative.   Cardiovascular: Negative.   Gastrointestinal: Positive for abdominal pain (left flank pain radiating to left groin ). Negative for blood in stool, constipation, diarrhea, nausea and vomiting.  Genitourinary: Positive for flank pain (Left flank pain radiating to LLQ ).  Musculoskeletal: Positive for back  pain (left lower back pain ).     Physical Exam Triage Vital Signs ED Triage Vitals [02/05/17 1036]  Enc Vitals Group     BP (!) 188/106     Pulse Rate 75     Resp 18     Temp 98.6 F (37 C)     Temp Source Oral     SpO2 98 %     Weight      Height      Head Circumference      Peak Flow      Pain Score      Pain Loc      Pain Edu?      Excl. in [place]?    No data found.  Updated Vital Signs BP (!) 188/106 (BP Location: Left Arm)   Pulse 75   Temp 98.6 F (37 C) (Oral)   Resp 18   SpO2 98%   Visual Acuity Right Eye Distance:   Left Eye Distance:     Bilateral Distance:    Right Eye Near:   Left Eye Near:    Bilateral Near:     Physical Exam  Constitutional: She is oriented to person, place, and time. She appears well-developed and well-nourished. No distress.  HENT:  Head: Normocephalic.  Eyes: Pupils are equal, round, and reactive to light.  Neck: Normal range of motion. Neck supple.  Cardiovascular: Normal rate, regular rhythm and normal heart sounds.  Pulmonary/Chest: Effort normal and breath sounds normal.  Abdominal: Soft. Bowel sounds are normal. She exhibits no distension and no mass. There is no tenderness. There is no rebound and no guarding. No hernia.  Musculoskeletal: She exhibits tenderness (left lower back pain/flank pain radiating to left groin . Pain to left lower back and paraspinuous process. Negative straight leg raise test B/L . Negative CVA tenderness. ).  Neurological: She is alert and oriented to person, place, and time. No cranial nerve deficit.  Skin: Skin is warm. Capillary refill takes less than 2 seconds.  Psychiatric: She has a normal mood and affect.     UC Treatments / Results  Labs (all labs ordered are listed, but only abnormal results are displayed) Labs Reviewed  POCT URINALYSIS DIP (DEVICE) - Abnormal; Notable for the following components:      Result Value   Hgb urine dipstick TRACE (*)    All other components within normal limits    EKG  EKG Interpretation None       Radiology No results found.  Procedures Procedures (including critical care time)  Medications Ordered in UC Medications - No data to display   Initial Impression / Assessment and Plan / UC Course  I have reviewed the triage vital signs and the nursing notes.  Pertinent labs & imaging results that were available during my care of the patient were reviewed by me and considered in my medical decision making (see chart for details).   Sx is a overlap of lower back pain and possible left renal calculi.  Microscopic hematuria on dipstick and personal and work history consistent with muscle strain. Creatine levels WNL. Warning signs discussed. If pain worsens along with nausea, vomiting, fever return to clinic or go to ED.  Encouraged to increase hydration. Will give Lisinopril 30 day supply with 1 refill to have enough time to establish care with PCP Will check BP prior to discharge    Final Clinical Impressions(s) / UC Diagnoses   Final diagnoses:  Back strain, initial encounter  Benign  essential HTN  Acute left flank pain    ED Discharge Orders    None       Controlled Substance Prescriptions [place] Controlled Substance Registry consulted? Not Applicable   Teola Bradley, NP 02/05/17 1228

## 2017-02-05 NOTE — ED Notes (Signed)
Patient has not voided at this time

## 2017-02-05 NOTE — ED Triage Notes (Signed)
Pt sts left sided flank and back pain into left leg x 1 week; pt noted to be hypertensive sts hx of same but does not currently take any meds

## 2017-02-05 NOTE — Discharge Instructions (Signed)
1) Flank pain could be a possible kidney stone: If pain worsens along with nausea, vomiting, fever return to clinic or go to ED.  Increase hydration which may help pass stone.  2) Advised to keep BP log. Encouraged to establish care with Primary care services for regular refills on BP medication and regular monitoring.  If headache, dizziness, change in vision, CP, SOB or palpitations noted return to clinic or go to ED.

## 2019-02-18 IMAGING — US US PELVIS COMPLETE
1 series · 15 of 25 positions shown · non-contrast
Comparison: None

CLINICAL DATA: Pelvic pain

EXAM:
TRANSABDOMINAL AND TRANSVAGINAL ULTRASOUND OF PELVIS
TECHNIQUE: Both transabdominal and transvaginal ultrasound examinations of the
pelvis were performed. Transabdominal technique was performed for
global imaging of the pelvis including uterus, ovaries, adnexal
regions, and pelvic cul-de-sac. It was necessary to proceed with
endovaginal exam following the transabdominal exam to visualize the
endometrium and ovaries.

[Series 1: us pelvis complete · 15 of 83 slices shown]
[im 1/83]
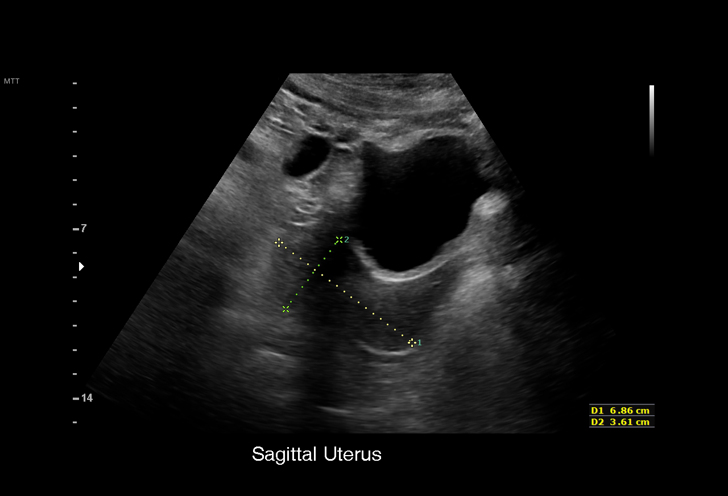
[im 7/83]
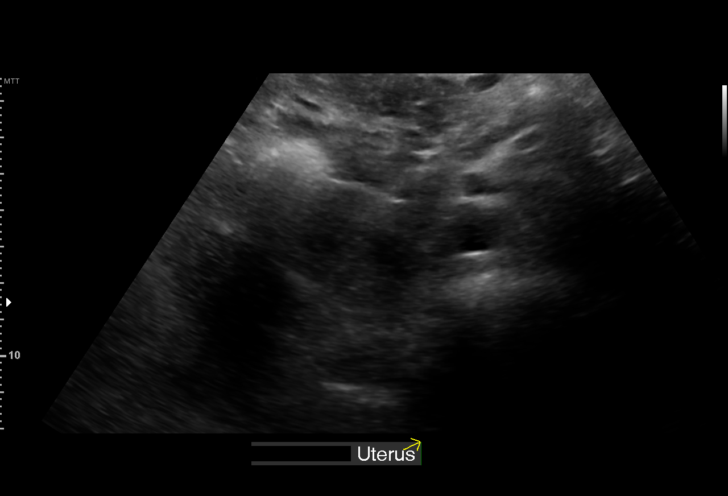
[im 14/83]
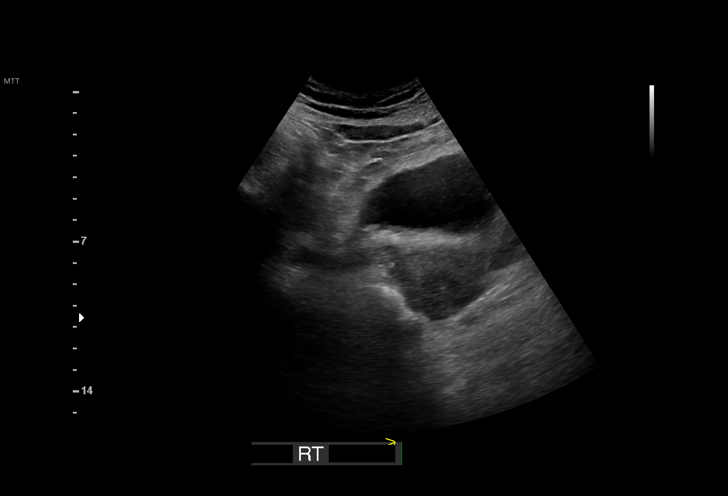
[im 18/83]
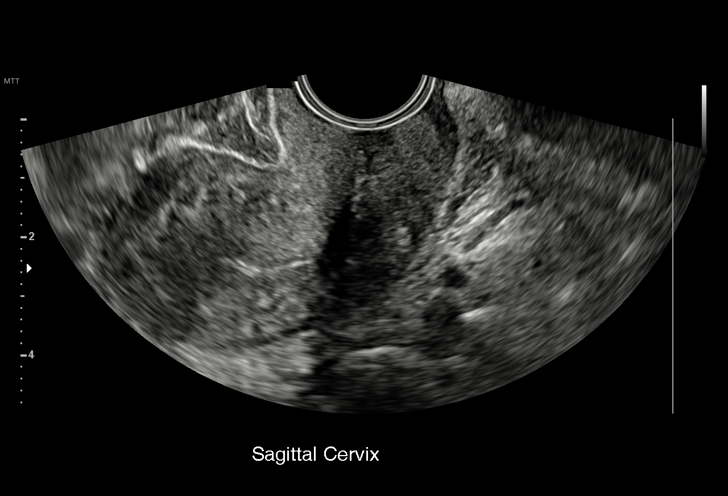
[im 24/83]
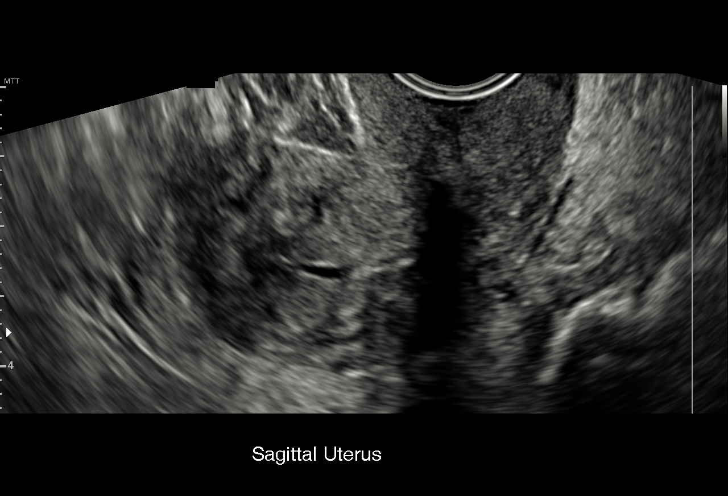
[im 31/83]
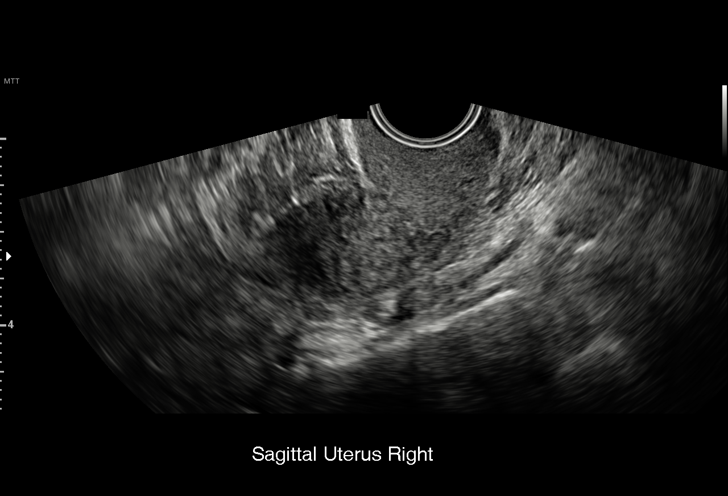
[im 35/83]
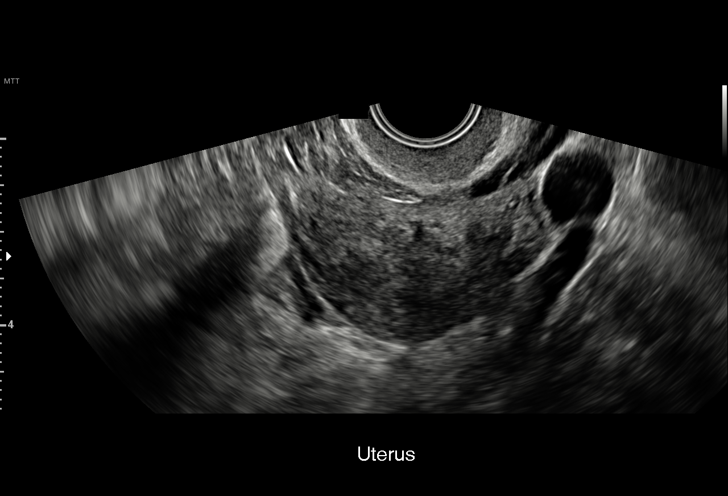
[im 42/83]
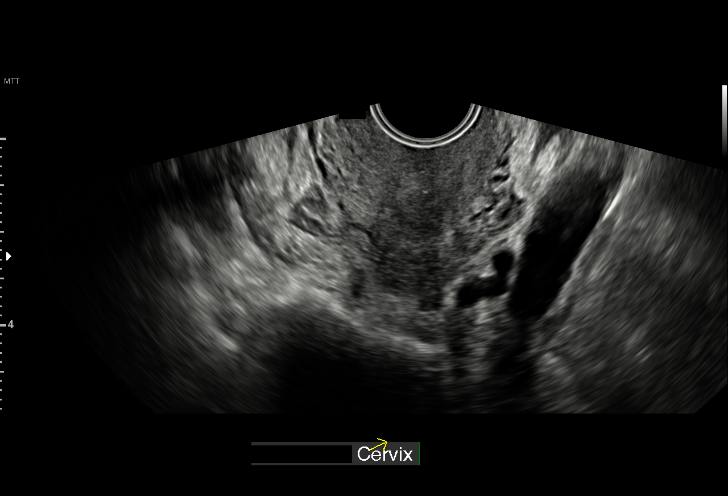
[im 48/83]
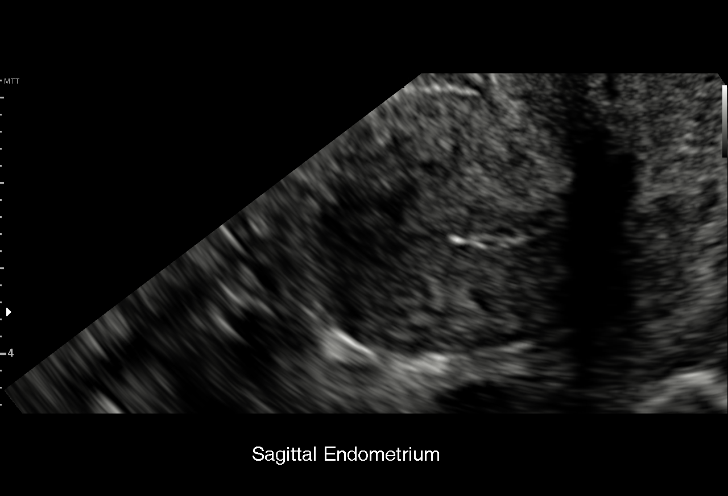
[im 52/83]
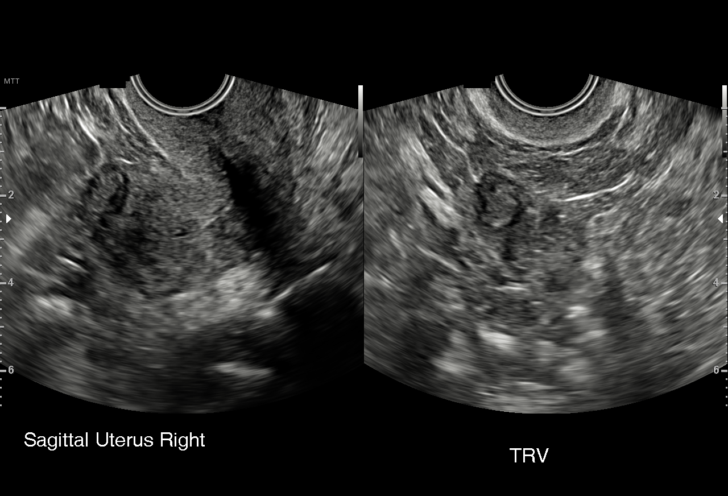
[im 59/83]
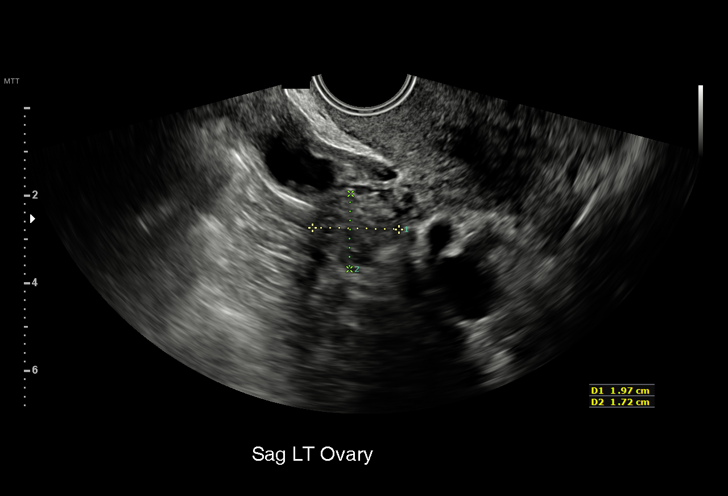
[im 65/83]
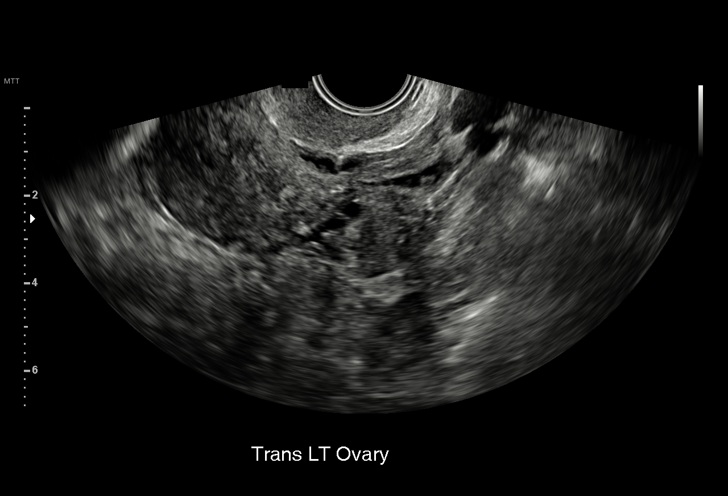
[im 69/83]
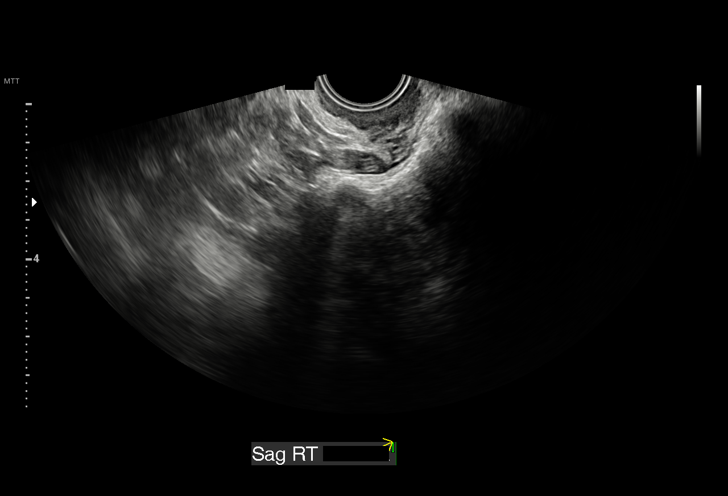
[im 76/83]
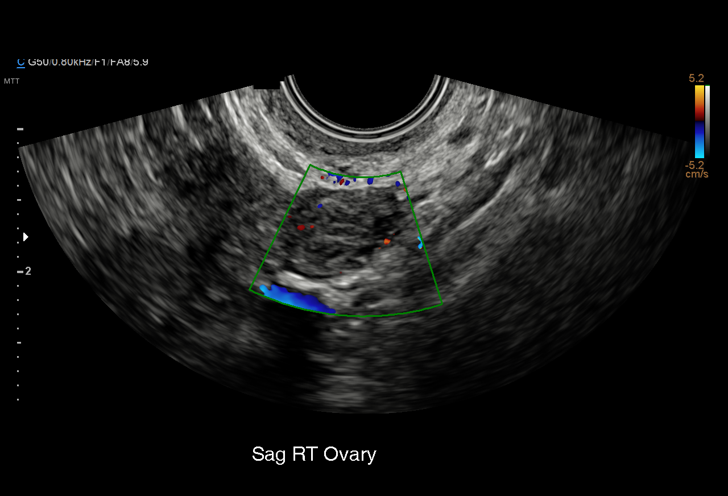
[im 83/83]
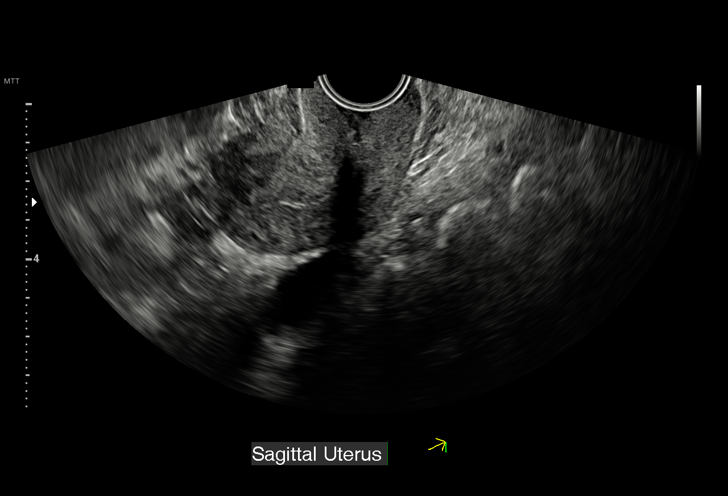

[15 of 25 positions shown; findings below may reference images not displayed]

FINDINGS: Uterus

Measurements: 6.3 x 2.9 x 4.4 cm. Probable right fundal subserosal
fibroid measuring up to 1.2 cm

Endometrium

Thickness: 3 mm in thickness. Small amount of fluid within the
endometrial canal. No focal abnormality visualized.

Right ovary

Measurements: 1.9 x 1.3 x 2.1 cm. Normal appearance/no adnexal mass.

Left ovary

Measurements: 0.0 x 1.7 x 2.4 cm. Normal appearance/no adnexal mass.

Other findings

No abnormal free fluid.
IMPRESSION: Small right fundal subserosal fibroid, 1.2 cm.

No acute findings.
